# Patient Record
Sex: Female | Born: 1960 | Hispanic: No | Marital: Married | Smoking: Never smoker
Health system: Southern US, Community
[De-identification: ages and names within clinical notes are randomized; demographics above are authoritative.]

## PROBLEM LIST (undated history)

## (undated) DIAGNOSIS — F419 Anxiety disorder, unspecified: Secondary | ICD-10-CM

## (undated) HISTORY — PX: FOOT SURGERY: SHX648

## (undated) HISTORY — PX: BREAST ENHANCEMENT SURGERY: SHX7

---

## 2005-12-28 ENCOUNTER — Other Ambulatory Visit: Admission: RE | Admit: 2005-12-28 | Discharge: 2005-12-28 | Payer: Self-pay | Admitting: Gynecology

## 2010-05-25 ENCOUNTER — Encounter: Payer: Self-pay | Admitting: Family Medicine

## 2013-10-13 ENCOUNTER — Encounter (HOSPITAL_BASED_OUTPATIENT_CLINIC_OR_DEPARTMENT_OTHER): Payer: Self-pay | Admitting: Emergency Medicine

## 2013-10-13 ENCOUNTER — Emergency Department (HOSPITAL_BASED_OUTPATIENT_CLINIC_OR_DEPARTMENT_OTHER)
Admission: EM | Admit: 2013-10-13 | Discharge: 2013-10-13 | Disposition: A | Discharge status: Home / Self Care | Payer: Worker's Compensation | Attending: Emergency Medicine | Admitting: Emergency Medicine

## 2013-10-13 ENCOUNTER — Emergency Department (HOSPITAL_BASED_OUTPATIENT_CLINIC_OR_DEPARTMENT_OTHER): Payer: Self-pay | Attending: Emergency Medicine

## 2013-10-13 DIAGNOSIS — W11XXXA Fall on and from ladder, initial encounter: Secondary | ICD-10-CM | POA: Insufficient documentation

## 2013-10-13 DIAGNOSIS — Y929 Unspecified place or not applicable: Secondary | ICD-10-CM | POA: Insufficient documentation

## 2013-10-13 DIAGNOSIS — S5292XA Unspecified fracture of left forearm, initial encounter for closed fracture: Secondary | ICD-10-CM

## 2013-10-13 DIAGNOSIS — IMO0002 Reserved for concepts with insufficient information to code with codable children: Secondary | ICD-10-CM | POA: Insufficient documentation

## 2013-10-13 DIAGNOSIS — Y9389 Activity, other specified: Secondary | ICD-10-CM | POA: Insufficient documentation

## 2013-10-13 DIAGNOSIS — S52123A Displaced fracture of head of unspecified radius, initial encounter for closed fracture: Secondary | ICD-10-CM | POA: Insufficient documentation

## 2013-10-13 DIAGNOSIS — F411 Generalized anxiety disorder: Secondary | ICD-10-CM | POA: Insufficient documentation

## 2013-10-13 DIAGNOSIS — Z79899 Other long term (current) drug therapy: Secondary | ICD-10-CM | POA: Insufficient documentation

## 2013-10-13 HISTORY — DX: Anxiety disorder, unspecified: F41.9

## 2013-10-13 MED ORDER — OXYCODONE-ACETAMINOPHEN 5-325 MG PO TABS: 1.0000 | ORAL_TABLET | ORAL | Status: AC | PRN

## 2013-10-13 MED ORDER — OXYCODONE-ACETAMINOPHEN 5-325 MG PO TABS
1.0000 | ORAL_TABLET | Freq: Once | ORAL | Status: AC
  Administered 2013-10-13: 1 via ORAL
  Filled 2013-10-13: qty 1

## 2013-10-13 NOTE — ED Notes (Signed)
Pt c/o left wrist injury after fall at work x 6 hrs ago

## 2013-10-13 NOTE — ED Provider Notes (Signed)
CSN: [id number]     Arrival date & time 10/13/13  1849 History   First MD Initiated Contact with Patient 10/13/13 2014     Chief Complaint  Patient presents with  . Wrist Pain     (Consider location/radiation/quality/duration/timing/severity/associated sxs/prior Treatment) Patient is a 53 y.o. female presenting with wrist pain. The history is provided by the patient and the spouse. No language interpreter was used.  Wrist Pain This is a new problem. The current episode started today. Pertinent negatives include no chills or fever. Associated symptoms comments: She missed the last step of a ladder and fell backward on to flexed left wrist causing injury. She landed in a sitting position and complains of pain of her tail bone. She has been ambulatory without difficulty. No head injury, neck or back pain, chest or abdominal pain. .    Past Medical History  Diagnosis Date  . Anxiety    No past surgical history on file. No family history on file. History  Substance Use Topics  . Smoking status: Not on file  . Smokeless tobacco: Not on file  . Alcohol Use: Not on file   OB History   Grav Para Term Preterm Abortions TAB SAB Ect Mult Living                 Review of Systems  Constitutional: Negative for fever and chills.  HENT: Negative.   Respiratory: Negative.   Cardiovascular: Negative.   Gastrointestinal: Negative.   Musculoskeletal:       See HPI.  Skin: Negative.   Neurological: Negative.       Allergies  Review of patient's allergies indicates not on file.  Home Medications   Prior to Admission medications   Medication Sig Start Date End Date Taking? Authorizing Provider  escitalopram (LEXAPRO) 10 MG tablet Take 10 mg by mouth daily.   Yes Historical Provider, MD  zolpidem (AMBIEN) 5 MG tablet Take 5 mg by mouth at bedtime as needed for sleep.   Yes Historical Provider, MD   BP 145/90  Pulse 72  Temp(Src) 98.2 F (36.8 C)  Resp 18  Ht 5\' 6"  (1.676 m)  Wt  140 lb (63.504 kg)  BMI 22.61 kg/m2  SpO2 98% Physical Exam  Constitutional: She is oriented to person, place, and time. She appears well-developed and well-nourished. No distress.  Pulmonary/Chest: She exhibits no tenderness.  Abdominal: There is no tenderness.  Musculoskeletal:  Left wrist swollen dorsally. No bony deformity. FROM all digits. ROM wrist limited by pain. No midline cervical tenderness.   Neurological: She is alert and oriented to person, place, and time.  Skin: Skin is warm and dry.  Psychiatric: She has a normal mood and affect.    ED Course  Procedures (including critical care time) Labs Review Labs Reviewed - No data to display  Imaging Review Dg Wrist Complete Left  10/13/2013   CLINICAL DATA:  Fall from ladder.  Pain, swelling in left wrist.  EXAM: LEFT WRIST - COMPLETE 3+ VIEW  COMPARISON:  None.  FINDINGS: There is a distal left radial fracture, nondisplaced. Probable intra-articular extension. Overlying soft tissue swelling. No ulnar abnormality.  IMPRESSION: Nondisplaced distal left radial fracture.   Electronically Signed   By: Charlett NoseKevin  Dover M.D.   On: 10/13/2013 19:36     EKG Interpretation None      MDM   Final diagnoses:  None    1. Radial fracture, left 2. Fall  Non-displaced, closed radial fracture - splinted.  No neurovascular compromise. Refer to orthopedics - Dr. Pearletha ForgeHudnall.    Arnoldo HookerShari A Lakyra Tippins, PA-C 10/13/13 2048

## 2013-10-13 NOTE — Discharge Instructions (Signed)
Cast or Splint Care Casts and splints support injured limbs and keep bones from moving while they heal. It is important to care for your cast or splint at home.  HOME CARE INSTRUCTIONS  Keep the cast or splint uncovered during the drying period. It can take 24 to 48 hours to dry if it is made of plaster. A fiberglass cast will dry in less than 1 hour.  Do not rest the cast on anything harder than a pillow for the first 24 hours.  Do not put weight on your injured limb or apply pressure to the cast until your health care provider gives you permission.  Keep the cast or splint dry. Wet casts or splints can lose their shape and may not support the limb as well. A wet cast that has lost its shape can also create harmful pressure on your skin when it dries. Also, wet skin can become infected.  Cover the cast or splint with a plastic bag when bathing or when out in the rain or snow. If the cast is on the trunk of the body, take sponge baths until the cast is removed.  If your cast does become wet, dry it with a towel or a blow dryer on the cool setting only.  Keep your cast or splint clean. Soiled casts may be wiped with a moistened cloth.  Do not place any hard or soft foreign objects under your cast or splint, such as cotton, toilet paper, lotion, or powder.  Do not try to scratch the skin under the cast with any object. The object could get stuck inside the cast. Also, scratching could lead to an infection. If itching is a problem, use a blow dryer on a cool setting to relieve discomfort.  Do not trim or cut your cast or remove padding from inside of it.  Exercise all joints next to the injury that are not immobilized by the cast or splint. For example, if you have a long leg cast, exercise the hip joint and toes. If you have an arm cast or splint, exercise the shoulder, elbow, thumb, and fingers.  Elevate your injured arm or leg on 1 or 2 pillows for the first 1 to 3 days to decrease  swelling and pain.It is best if you can comfortably elevate your cast so it is higher than your heart. SEEK MEDICAL CARE IF:   Your cast or splint cracks.  Your cast or splint is too tight or too loose.  You have unbearable itching inside the cast.  Your cast becomes wet or develops a soft spot or area.  You have a bad smell coming from inside your cast.  You get an object stuck under your cast.  Your skin around the cast becomes red or raw.  You have new pain or worsening pain after the cast has been applied. SEEK IMMEDIATE MEDICAL CARE IF:   You have fluid leaking through the cast.  You are unable to move your fingers or toes.  You have discolored (blue or white), cool, painful, or very swollen fingers or toes beyond the cast.  You have tingling or numbness around the injured area.  You have severe pain or pressure under the cast.  You have any difficulty with your breathing or have shortness of breath.  You have chest pain. Document Released: 04/17/2000 Document Revised: 02/08/2013 Document Reviewed: 10/27/2012 [facility] Patient Information 2014 [place], [place].  Forearm Fracture Your caregiver has diagnosed you as having a broken bone (fracture) of  the forearm. This is the part of your arm between the elbow and your wrist. Your forearm is made up of two bones. These are the radius and ulna. A fracture is a break in one or both bones. A cast or splint is used to protect and keep your injured bone from moving. The cast or splint will be on generally for about 5 to 6 weeks, with individual variations. HOME CARE INSTRUCTIONS   Keep the injured part elevated while sitting or lying down. Keeping the injury above the level of your heart (the center of the chest). This will decrease swelling and pain.  Apply ice to the injury for 15-20 minutes, 03-04 times per day while awake, for 2 days. Put the ice in a plastic bag and place a thin towel between the bag of ice and your cast or  splint.  If you have a plaster or fiberglass cast:  Do not try to scratch the skin under the cast using sharp or pointed objects.  Check the skin around the cast every day. You may put lotion on any red or sore areas.  Keep your cast dry and clean.  If you have a plaster splint:  Wear the splint as directed.  You may loosen the elastic around the splint if your fingers become numb, tingle, or turn cold or blue.  Do not put pressure on any part of your cast or splint. It may break. Rest your cast only on a pillow the first 24 hours until it is fully hardened.  Your cast or splint can be protected during bathing with a plastic bag. Do not lower the cast or splint into water.  Only take over-the-counter or prescription medicines for pain, discomfort, or fever as directed by your caregiver. SEEK IMMEDIATE MEDICAL CARE IF:   Your cast gets damaged or breaks.  You have more severe pain or swelling than you did before the cast.  Your skin or nails below the injury turn blue or gray, or feel cold or numb.  There is a bad smell or new stains and/or pus like (purulent) drainage coming from under the cast. MAKE SURE YOU:   Understand these instructions.  Will watch your condition.  Will get help right away if you are not doing well or get worse. Document Released: 04/17/2000 Document Revised: 07/13/2011 Document Reviewed: 12/08/2007 [facility] Patient Information 2014 [place], [place]. Cryotherapy Cryotherapy means treatment with cold. Ice or gel packs can be used to reduce both pain and swelling. Ice is the most helpful within the first 24 to 48 hours after an injury or flareup from overusing a muscle or joint. Sprains, strains, spasms, burning pain, shooting pain, and aches can all be eased with ice. Ice can also be used when recovering from surgery. Ice is effective, has very few side effects, and is safe for most people to use. PRECAUTIONS  Ice is not a safe treatment option for  people with:  Raynaud's phenomenon. This is a condition affecting small blood vessels in the extremities. Exposure to cold may cause your problems to return.  Cold hypersensitivity. There are many forms of cold hypersensitivity, including:  Cold urticaria. Red, itchy hives appear on the skin when the tissues begin to warm after being iced.  Cold erythema. This is a red, itchy rash caused by exposure to cold.  Cold hemoglobinuria. Red blood cells break down when the tissues begin to warm after being iced. The hemoglobin that carry oxygen are passed into the urine because they  cannot combine with blood proteins fast enough.  Numbness or altered sensitivity in the area being iced. If you have any of the following conditions, do not use ice until you have discussed cryotherapy with your caregiver:  Heart conditions, such as arrhythmia, angina, or chronic heart disease.  High blood pressure.  Healing wounds or open skin in the area being iced.  Current infections.  Rheumatoid arthritis.  Poor circulation.  Diabetes. Ice slows the blood flow in the region it is applied. This is beneficial when trying to stop inflamed tissues from spreading irritating chemicals to surrounding tissues. However, if you expose your skin to cold temperatures for too long or without the proper protection, you can damage your skin or nerves. Watch for signs of skin damage due to cold. HOME CARE INSTRUCTIONS Follow these tips to use ice and cold packs safely.  Place a dry or damp towel between the ice and skin. A damp towel will cool the skin more quickly, so you may need to shorten the time that the ice is used.  For a more rapid response, add gentle compression to the ice.  Ice for no more than 10 to 20 minutes at a time. The bonier the area you are icing, the less time it will take to get the benefits of ice.  Check your skin after 5 minutes to make sure there are no signs of a poor response to cold or  skin damage.  Rest 20 minutes or more in between uses.  Once your skin is numb, you can end your treatment. You can test numbness by very lightly touching your skin. The touch should be so light that you do not see the skin dimple from the pressure of your fingertip. When using ice, most people will feel these normal sensations in this order: cold, burning, aching, and numbness.  Do not use ice on someone who cannot communicate their responses to pain, such as small children or people with dementia. HOW TO MAKE AN ICE PACK Ice packs are the most common way to use ice therapy. Other methods include ice massage, ice baths, and cryo-sprays. Muscle creams that cause a cold, tingly feeling do not offer the same benefits that ice offers and should not be used as a substitute unless recommended by your caregiver. To make an ice pack, do one of the following:  Place crushed ice or a bag of frozen vegetables in a sealable plastic bag. Squeeze out the excess air. Place this bag inside another plastic bag. Slide the bag into a pillowcase or place a damp towel between your skin and the bag.  Mix 3 parts water with 1 part rubbing alcohol. Freeze the mixture in a sealable plastic bag. When you remove the mixture from the freezer, it will be slushy. Squeeze out the excess air. Place this bag inside another plastic bag. Slide the bag into a pillowcase or place a damp towel between your skin and the bag. SEEK MEDICAL CARE IF:  You develop white spots on your skin. This may give the skin a blotchy (mottled) appearance.  Your skin turns blue or pale.  Your skin becomes waxy or hard.  Your swelling gets worse. MAKE SURE YOU:   Understand these instructions.  Will watch your condition.  Will get help right away if you are not doing well or get worse. Document Released: 12/15/2010 Document Revised: 07/13/2011 Document Reviewed: 12/15/2010 [facility] Patient Information 2014 [place], [place].

## 2013-10-14 NOTE — ED Provider Notes (Signed)
Medical screening examination/treatment/procedure(s) were performed by non-physician practitioner and as supervising physician I was immediately available for consultation/collaboration.   EKG Interpretation None        Vanetta MuldersScott Kerie Badger, MD 10/14/13 1645

## 2013-10-16 ENCOUNTER — Ambulatory Visit: Payer: Self-pay | Admitting: Family Medicine

## 2013-10-16 ENCOUNTER — Encounter: Payer: Self-pay | Admitting: Family Medicine

## 2013-10-16 ENCOUNTER — Ambulatory Visit (INDEPENDENT_AMBULATORY_CARE_PROVIDER_SITE_OTHER): Payer: Worker's Compensation | Admitting: Family Medicine

## 2013-10-16 VITALS — BP 122/75 | HR 73 | Ht 66.0 in | Wt 140.0 lb

## 2013-10-16 DIAGNOSIS — S52599A Other fractures of lower end of unspecified radius, initial encounter for closed fracture: Secondary | ICD-10-CM

## 2013-10-16 DIAGNOSIS — S52502A Unspecified fracture of the lower end of left radius, initial encounter for closed fracture: Secondary | ICD-10-CM

## 2013-10-16 NOTE — Patient Instructions (Addendum)
Wear splint for 7-10 days then follow up with me. We will remove this, repeat x-rays and put a cast on you at that time. Elevate above the level of your heart as much as possible. Aleve, oxycodone as needed for pain.

## 2013-10-17 ENCOUNTER — Encounter: Payer: Self-pay | Admitting: Family Medicine

## 2013-10-17 DIAGNOSIS — S52502A Unspecified fracture of the lower end of left radius, initial encounter for closed fracture: Secondary | ICD-10-CM | POA: Insufficient documentation

## 2013-10-17 NOTE — Progress Notes (Signed)
Patient ID: Bailey Rodriguez, female   DOB: [birth year], 53 y.o.   MRN: [id number]  PCP: Almedia BallsKELLY,SAM, MD  Subjective:   HPI: Patient is a 53 y.o. female here for left arm injury.  Patient reports on 6/12 she fell off a ladder down 2 steps and then backwards. Tried to catch herself and sustained FOOSH injury to left wrist. Immediate pain, swelling. No history of injuries here. Is right handed. Tried half the pain medication because full dose made her nauseous.  Past Medical History  Diagnosis Date  . Anxiety     Current Outpatient Prescriptions on File Prior to Visit  Medication Sig Dispense Refill  . escitalopram (LEXAPRO) 10 MG tablet Take 10 mg by mouth daily.      Marland Kitchen. oxyCODONE-acetaminophen (PERCOCET/ROXICET) 5-325 MG per tablet Take 1-2 tablets by mouth every 4 (four) hours as needed for severe pain.  15 tablet  0  . zolpidem (AMBIEN) 5 MG tablet Take 5 mg by mouth at bedtime as needed for sleep.       No current facility-administered medications on file prior to visit.    Past Surgical History  Procedure Laterality Date  . Foot surgery    . Breast enhancement surgery      No Known Allergies  History   Social History  . Marital Status: Married    Spouse Name: N/A    Number of Children: N/A  . Years of Education: N/A   Occupational History  . Not on file.   Social History Main Topics  . Smoking status: Never Smoker   . Smokeless tobacco: Not on file  . Alcohol Use: Not on file  . Drug Use: Not on file  . Sexual Activity: Not on file   Other Topics Concern  . Not on file   Social History Narrative  . No narrative on file    No family history on file.  BP 122/75  Pulse 73  Ht 5\' 6"  (1.676 m)  Wt 140 lb (63.504 kg)  BMI 22.61 kg/m2  Review of Systems: See HPI above.    Objective:  Physical Exam:  Gen: NAD  Left wrist: Did not remove wrist brace. Able to flex, extend, abduct digits. NVI distally.    Assessment & Plan:  1. Nondisplaced left  distal radius fracture - does extend into radiocarpal joint.  Will use splint for 7-10 days then return to have this removed, repeat radiographs.  Discussed higher risk of nonunion, DJD from intraarticular extension but this is nondisplaced which is a positive sign.  Aleve, oxycodone prn.

## 2013-10-17 NOTE — Assessment & Plan Note (Signed)
Nondisplaced left distal radius fracture - does extend into radiocarpal joint.  Will use splint for 7-10 days then return to have this removed, repeat radiographs.  Discussed higher risk of nonunion, DJD from intraarticular extension but this is nondisplaced which is a positive sign.  Aleve, oxycodone prn.

## 2013-10-20 ENCOUNTER — Encounter: Payer: Self-pay | Admitting: Family Medicine

## 2013-10-20 ENCOUNTER — Ambulatory Visit (INDEPENDENT_AMBULATORY_CARE_PROVIDER_SITE_OTHER): Payer: Worker's Compensation | Admitting: Family Medicine

## 2013-10-20 ENCOUNTER — Ambulatory Visit (HOSPITAL_BASED_OUTPATIENT_CLINIC_OR_DEPARTMENT_OTHER)
Admission: RE | Admit: 2013-10-20 | Discharge: 2013-10-20 | Disposition: A | Discharge status: Home / Self Care | Payer: Worker's Compensation | Source: Ambulatory Visit | Attending: Family Medicine | Admitting: Family Medicine

## 2013-10-20 ENCOUNTER — Telehealth: Payer: Self-pay | Admitting: Family Medicine

## 2013-10-20 VITALS — BP 155/93 | HR 65 | Ht 66.0 in | Wt 140.0 lb

## 2013-10-20 DIAGNOSIS — S5290XD Unspecified fracture of unspecified forearm, subsequent encounter for closed fracture with routine healing: Secondary | ICD-10-CM

## 2013-10-20 DIAGNOSIS — Z4789 Encounter for other orthopedic aftercare: Secondary | ICD-10-CM | POA: Insufficient documentation

## 2013-10-20 DIAGNOSIS — S52502D Unspecified fracture of the lower end of left radius, subsequent encounter for closed fracture with routine healing: Secondary | ICD-10-CM

## 2013-10-20 NOTE — Telephone Encounter (Signed)
If she can come in asap we can see her.  She's a week out so we could switch her to a cast if the splint is rubbing on her.

## 2013-10-23 ENCOUNTER — Encounter: Payer: Self-pay | Admitting: Family Medicine

## 2013-10-23 NOTE — Progress Notes (Signed)
Patient ID: Bailey Rodriguez, female   DOB: [birth year], 53 y.o.   MRN: [id number]  PCP: Almedia BallsKELLY,SAM, MD  Subjective:   HPI: Patient is a 53 y.o. female here for left arm injury.  6/15: Patient reports on 6/12 she fell off a ladder down 2 steps and then backwards. Tried to catch herself and sustained FOOSH injury to left wrist. Immediate pain, swelling. No history of injuries here. Is right handed. Tried half the pain medication because full dose made her nauseous.  6/19: Patient returns a little early because splint feels like it's rubbing too much and hurting at elbow and hand. No other complaints. Has percocet to take as needed.  Past Medical History  Diagnosis Date  . Anxiety     Current Outpatient Prescriptions on File Prior to Visit  Medication Sig Dispense Refill  . escitalopram (LEXAPRO) 10 MG tablet Take 10 mg by mouth daily.      Marland Kitchen. oxyCODONE-acetaminophen (PERCOCET/ROXICET) 5-325 MG per tablet Take 1-2 tablets by mouth every 4 (four) hours as needed for severe pain.  15 tablet  0  . zolpidem (AMBIEN) 5 MG tablet Take 5 mg by mouth at bedtime as needed for sleep.       No current facility-administered medications on file prior to visit.    Past Surgical History  Procedure Laterality Date  . Foot surgery    . Breast enhancement surgery      No Known Allergies  History   Social History  . Marital Status: Married    Spouse Name: N/A    Number of Children: N/A  . Years of Education: N/A   Occupational History  . Not on file.   Social History Main Topics  . Smoking status: Never Smoker   . Smokeless tobacco: Not on file  . Alcohol Use: Not on file  . Drug Use: Not on file  . Sexual Activity: Not on file   Other Topics Concern  . Not on file   Social History Narrative  . No narrative on file    No family history on file.  BP 155/93  Pulse 65  Ht 5\' 6"  (1.676 m)  Wt 140 lb (63.504 kg)  BMI 22.61 kg/m2  Review of Systems: See HPI above.     Objective:  Physical Exam:  Gen: NAD  Left wrist: Splint removed.   Mild swelling, bruising.  No other deformity. TTP distal radius.  No other tenderness. Did not test wrist ROM with known fracture.  Able to extend, abduct, oppose digits. NVI distally.    Assessment & Plan:  1. Nondisplaced left distal radius fracture - does extend into radiocarpal joint.  Repeat radiographs unchanged.   Short arm cast placed today.  Aleve, oxycodone prn.  F/u in 2 weeks for reevaluation, consider repeating x-rays at that visit after cast removal.  Will need at least 6 weeks immobilization.

## 2013-10-23 NOTE — Assessment & Plan Note (Signed)
Nondisplaced left distal radius fracture - does extend into radiocarpal joint.  Repeat radiographs unchanged.   Short arm cast placed today.  Aleve, oxycodone prn.  F/u in 2 weeks for reevaluation, consider repeating x-rays at that visit after cast removal.  Will need at least 6 weeks immobilization.

## 2013-10-25 ENCOUNTER — Ambulatory Visit: Payer: Self-pay | Admitting: Family Medicine

## 2013-11-02 ENCOUNTER — Ambulatory Visit (HOSPITAL_BASED_OUTPATIENT_CLINIC_OR_DEPARTMENT_OTHER)
Admission: RE | Admit: 2013-11-02 | Discharge: 2013-11-02 | Disposition: A | Discharge status: Home / Self Care | Payer: Worker's Compensation | Source: Ambulatory Visit | Attending: Family Medicine | Admitting: Family Medicine

## 2013-11-02 ENCOUNTER — Encounter: Payer: Self-pay | Admitting: Family Medicine

## 2013-11-02 ENCOUNTER — Ambulatory Visit (INDEPENDENT_AMBULATORY_CARE_PROVIDER_SITE_OTHER): Payer: Worker's Compensation | Admitting: Family Medicine

## 2013-11-02 VITALS — BP 111/76 | HR 79 | Ht 66.0 in | Wt 140.0 lb

## 2013-11-02 DIAGNOSIS — S6992XD Unspecified injury of left wrist, hand and finger(s), subsequent encounter: Secondary | ICD-10-CM

## 2013-11-02 DIAGNOSIS — S6990XA Unspecified injury of unspecified wrist, hand and finger(s), initial encounter: Secondary | ICD-10-CM

## 2013-11-02 DIAGNOSIS — S52502D Unspecified fracture of the lower end of left radius, subsequent encounter for closed fracture with routine healing: Secondary | ICD-10-CM

## 2013-11-02 DIAGNOSIS — S59909A Unspecified injury of unspecified elbow, initial encounter: Secondary | ICD-10-CM

## 2013-11-02 DIAGNOSIS — Z4789 Encounter for other orthopedic aftercare: Secondary | ICD-10-CM | POA: Insufficient documentation

## 2013-11-02 DIAGNOSIS — Z5189 Encounter for other specified aftercare: Secondary | ICD-10-CM

## 2013-11-02 DIAGNOSIS — S59919A Unspecified injury of unspecified forearm, initial encounter: Secondary | ICD-10-CM

## 2013-11-02 DIAGNOSIS — S5290XD Unspecified fracture of unspecified forearm, subsequent encounter for closed fracture with routine healing: Secondary | ICD-10-CM

## 2013-11-06 ENCOUNTER — Encounter: Payer: Self-pay | Admitting: Family Medicine

## 2013-11-06 NOTE — Assessment & Plan Note (Signed)
Nondisplaced left distal radius fracture - Repeat radiographs show interval healing.  New cast placed today.  F/u when she is 6 weeks out to remove cast.  Consider repeat radiographs if she is still having pain at that time.

## 2013-11-06 NOTE — Progress Notes (Signed)
Patient ID: Bailey Rodriguez, female   DOB: May 15, 1960, [age] y.o.   MRN: [id number]  PCP: Almedia BallsKELLY,SAM, MD  Subjective:   HPI: Patient is a 53 y.o. female here for left arm injury.  6/15: Patient reports on 6/12 she fell off a ladder down 2 steps and then backwards. Tried to catch herself and sustained FOOSH injury to left wrist. Immediate pain, swelling. No history of injuries here. Is right handed. Tried half the pain medication because full dose made her nauseous.  6/19: Patient returns a little early because splint feels like it's rubbing too much and hurting at elbow and hand. No other complaints. Has percocet to take as needed.  7/2: Patient reports pain has improved though not completely. Pain worse at night. No other complaints.  Past Medical History  Diagnosis Date  . Anxiety     Current Outpatient Prescriptions on File Prior to Visit  Medication Sig Dispense Refill  . escitalopram (LEXAPRO) 10 MG tablet Take 10 mg by mouth daily.      Marland Kitchen. oxyCODONE-acetaminophen (PERCOCET/ROXICET) 5-325 MG per tablet Take 1-2 tablets by mouth every 4 (four) hours as needed for severe pain.  15 tablet  0  . zolpidem (AMBIEN) 5 MG tablet Take 5 mg by mouth at bedtime as needed for sleep.       No current facility-administered medications on file prior to visit.    Past Surgical History  Procedure Laterality Date  . Foot surgery    . Breast enhancement surgery      No Known Allergies  History   Social History  . Marital Status: Married    Spouse Name: N/A    Number of Children: N/A  . Years of Education: N/A   Occupational History  . Not on file.   Social History Main Topics  . Smoking status: Never Smoker   . Smokeless tobacco: Not on file  . Alcohol Use: Not on file  . Drug Use: Not on file  . Sexual Activity: Not on file   Other Topics Concern  . Not on file   Social History Narrative  . No narrative on file    No family history on file.  BP 111/76  Pulse 79   Ht 5\' 6"  (1.676 m)  Wt 140 lb (63.504 kg)  BMI 22.61 kg/m2  Review of Systems: See HPI above.    Objective:  Physical Exam:  Gen: NAD  Left wrist: Cast removed.   No swelling, bruising, other deformity. Mild TTP distal radius.  No other tenderness. Did not test wrist ROM with known fracture.  Able to extend, abduct, oppose digits. NVI distally.    Assessment & Plan:  1. Nondisplaced left distal radius fracture - Repeat radiographs show interval healing.  New cast placed today.  F/u when she is 6 weeks out to remove cast.  Consider repeat radiographs if she is still having pain at that time.

## 2013-11-10 ENCOUNTER — Encounter: Payer: Self-pay | Admitting: Family Medicine

## 2013-11-10 ENCOUNTER — Ambulatory Visit (INDEPENDENT_AMBULATORY_CARE_PROVIDER_SITE_OTHER): Payer: Worker's Compensation | Admitting: Family Medicine

## 2013-11-10 VITALS — BP 110/81 | HR 72 | Ht 66.0 in | Wt 140.0 lb

## 2013-11-10 DIAGNOSIS — S5290XD Unspecified fracture of unspecified forearm, subsequent encounter for closed fracture with routine healing: Secondary | ICD-10-CM

## 2013-11-10 DIAGNOSIS — S52502D Unspecified fracture of the lower end of left radius, subsequent encounter for closed fracture with routine healing: Secondary | ICD-10-CM

## 2013-11-16 ENCOUNTER — Encounter: Payer: Self-pay | Admitting: Family Medicine

## 2013-11-16 NOTE — Progress Notes (Signed)
Patient ID: Bailey GettingMarie Rodriguez, female   DOB: [birth year], 53 y.o.   MRN: [id number]  PCP: Almedia BallsKELLY,SAM, MD  Subjective:   HPI: Patient is a 53 y.o. female here for left arm injury.  6/15: Patient reports on 6/12 she fell off a ladder down 2 steps and then backwards. Tried to catch herself and sustained FOOSH injury to left wrist. Immediate pain, swelling. No history of injuries here. Is right handed. Tried half the pain medication because full dose made her nauseous.  6/19: Patient returns a little early because splint feels like it's rubbing too much and hurting at elbow and hand. No other complaints. Has percocet to take as needed.  7/2: Patient reports pain has improved though not completely. Pain worse at night. No other complaints.  7/10: Patient returned early today as cast feels uncomfortable, feels like it's making her wrist and pinky finger numb and feels too tight.  Past Medical History  Diagnosis Date  . Anxiety     Current Outpatient Prescriptions on File Prior to Visit  Medication Sig Dispense Refill  . escitalopram (LEXAPRO) 10 MG tablet Take 10 mg by mouth daily.      Marland Kitchen. oxyCODONE-acetaminophen (PERCOCET/ROXICET) 5-325 MG per tablet Take 1-2 tablets by mouth every 4 (four) hours as needed for severe pain.  15 tablet  0  . zolpidem (AMBIEN) 5 MG tablet Take 5 mg by mouth at bedtime as needed for sleep.       No current facility-administered medications on file prior to visit.    Past Surgical History  Procedure Laterality Date  . Foot surgery    . Breast enhancement surgery      No Known Allergies  History   Social History  . Marital Status: Married    Spouse Name: N/A    Number of Children: N/A  . Years of Education: N/A   Occupational History  . Not on file.   Social History Main Topics  . Smoking status: Never Smoker   . Smokeless tobacco: Not on file  . Alcohol Use: Not on file  . Drug Use: Not on file  . Sexual Activity: Not on file    Other Topics Concern  . Not on file   Social History Narrative  . No narrative on file    No family history on file.  BP 110/81  Pulse 72  Ht 5\' 6"  (1.676 m)  Wt 140 lb (63.504 kg)  BMI 22.61 kg/m2  Review of Systems: See HPI above.    Objective:  Physical Exam:  Gen: NAD  Left wrist: Cast removed.   No swelling, bruising, other deformity. Minimal TTP distal radius.  No other tenderness. Did not test wrist ROM with known fracture.  Able to extend, abduct, oppose digits. NVI distally.    Assessment & Plan:  1. Nondisplaced left distal radius fracture - Cast removed today.  Minimal tenderness on exam and last visit had healing noted on radiographs.  Will switch to a wrist brace as this should feel more comfortable and with the healing already, very unlikely to have nonunion or displacement at this point.  Consider repeat radiographs at next visit when 6 weeks out depending on her clinical picture.

## 2013-11-16 NOTE — Assessment & Plan Note (Signed)
Nondisplaced left distal radius fracture - Cast removed today.  Minimal tenderness on exam and last visit had healing noted on radiographs.  Will switch to a wrist brace as this should feel more comfortable and with the healing already, very unlikely to have nonunion or displacement at this point.  Consider repeat radiographs at next visit when 6 weeks out depending on her clinical picture.

## 2013-11-23 ENCOUNTER — Encounter: Payer: Self-pay | Admitting: Family Medicine

## 2013-11-23 ENCOUNTER — Ambulatory Visit (INDEPENDENT_AMBULATORY_CARE_PROVIDER_SITE_OTHER): Payer: Worker's Compensation | Admitting: Family Medicine

## 2013-11-23 ENCOUNTER — Ambulatory Visit (HOSPITAL_BASED_OUTPATIENT_CLINIC_OR_DEPARTMENT_OTHER)
Admission: RE | Admit: 2013-11-23 | Discharge: 2013-11-23 | Disposition: A | Discharge status: Home / Self Care | Payer: Worker's Compensation | Source: Ambulatory Visit | Attending: Family Medicine | Admitting: Family Medicine

## 2013-11-23 VITALS — BP 117/79 | HR 75 | Ht 66.0 in | Wt 140.0 lb

## 2013-11-23 DIAGNOSIS — S5290XD Unspecified fracture of unspecified forearm, subsequent encounter for closed fracture with routine healing: Secondary | ICD-10-CM

## 2013-11-23 DIAGNOSIS — S52502D Unspecified fracture of the lower end of left radius, subsequent encounter for closed fracture with routine healing: Secondary | ICD-10-CM

## 2013-11-23 DIAGNOSIS — Z4789 Encounter for other orthopedic aftercare: Secondary | ICD-10-CM | POA: Insufficient documentation

## 2013-11-24 ENCOUNTER — Encounter: Payer: Self-pay | Admitting: Family Medicine

## 2013-11-24 NOTE — Assessment & Plan Note (Signed)
Nondisplaced left distal radius fracture - 6 weeks out now and radiographs again showed healing.  Believe majority of her pain at this point is due to stiffness.  Will start ROM exercises at this point to regain motion, use wrist brace as needed.  Consider occupational therapy if still having trouble.  Otherwise f/u prn.  Note written to start light duty and transition to full duty.

## 2013-11-24 NOTE — Progress Notes (Signed)
Patient ID: Bailey Rodriguez, female   DOB: [birth year], 53 y.o.   MRN: [id number]  PCP: Almedia BallsKELLY,SAM, MD  Subjective:   HPI: Patient is a 53 y.o. female here for left arm injury.  6/15: Patient reports on 6/12 she fell off a ladder down 2 steps and then backwards. Tried to catch herself and sustained FOOSH injury to left wrist. Immediate pain, swelling. No history of injuries here. Is right handed. Tried half the pain medication because full dose made her nauseous.  6/19: Patient returns a little early because splint feels like it's rubbing too much and hurting at elbow and hand. No other complaints. Has percocet to take as needed.  7/2: Patient reports pain has improved though not completely. Pain worse at night. No other complaints.  7/10: Patient returned early today as cast feels uncomfortable, feels like it's making her wrist and pinky finger numb and feels too tight.  7/23: Patient reports she's doing well. Wrist feels sore but improved. Tolerating brace without any issues.  Past Medical History  Diagnosis Date  . Anxiety     Current Outpatient Prescriptions on File Prior to Visit  Medication Sig Dispense Refill  . escitalopram (LEXAPRO) 10 MG tablet Take 10 mg by mouth daily.      Marland Kitchen. oxyCODONE-acetaminophen (PERCOCET/ROXICET) 5-325 MG per tablet Take 1-2 tablets by mouth every 4 (four) hours as needed for severe pain.  15 tablet  0  . zolpidem (AMBIEN) 5 MG tablet Take 5 mg by mouth at bedtime as needed for sleep.       No current facility-administered medications on file prior to visit.    Past Surgical History  Procedure Laterality Date  . Foot surgery    . Breast enhancement surgery      No Known Allergies  History   Social History  . Marital Status: Married    Spouse Name: N/A    Number of Children: N/A  . Years of Education: N/A   Occupational History  . Not on file.   Social History Main Topics  . Smoking status: Never Smoker   . Smokeless  tobacco: Not on file  . Alcohol Use: Not on file  . Drug Use: Not on file  . Sexual Activity: Not on file   Other Topics Concern  . Not on file   Social History Narrative  . No narrative on file    No family history on file.  BP 117/79  Pulse 75  Ht 5\' 6"  (1.676 m)  Wt 140 lb (63.504 kg)  BMI 22.61 kg/m2  Review of Systems: See HPI above.    Objective:  Physical Exam:  Gen: NAD  Left wrist: No swelling, bruising, other deformity. Minimal TTP wrist joint, distal radius.  No other tenderness. Mod limitation ROM wrist with mild pain.  Able to extend, abduct, oppose digits. NVI distally.    Assessment & Plan:  1. Nondisplaced left distal radius fracture - 6 weeks out now and radiographs again showed healing.  Believe majority of her pain at this point is due to stiffness.  Will start ROM exercises at this point to regain motion, use wrist brace as needed.  Consider occupational therapy if still having trouble.  Otherwise f/u prn.  Note written to start light duty and transition to full duty.

## 2015-06-15 IMAGING — CR DG WRIST COMPLETE 3+V*L*
4 series · 4 of 4 positions shown · non-contrast
Comparison: None.

CLINICAL DATA: Fall from ladder.  Pain, swelling in left wrist.

EXAM:
LEFT WRIST - COMPLETE 3+ VIEW

[x wrist pa left]
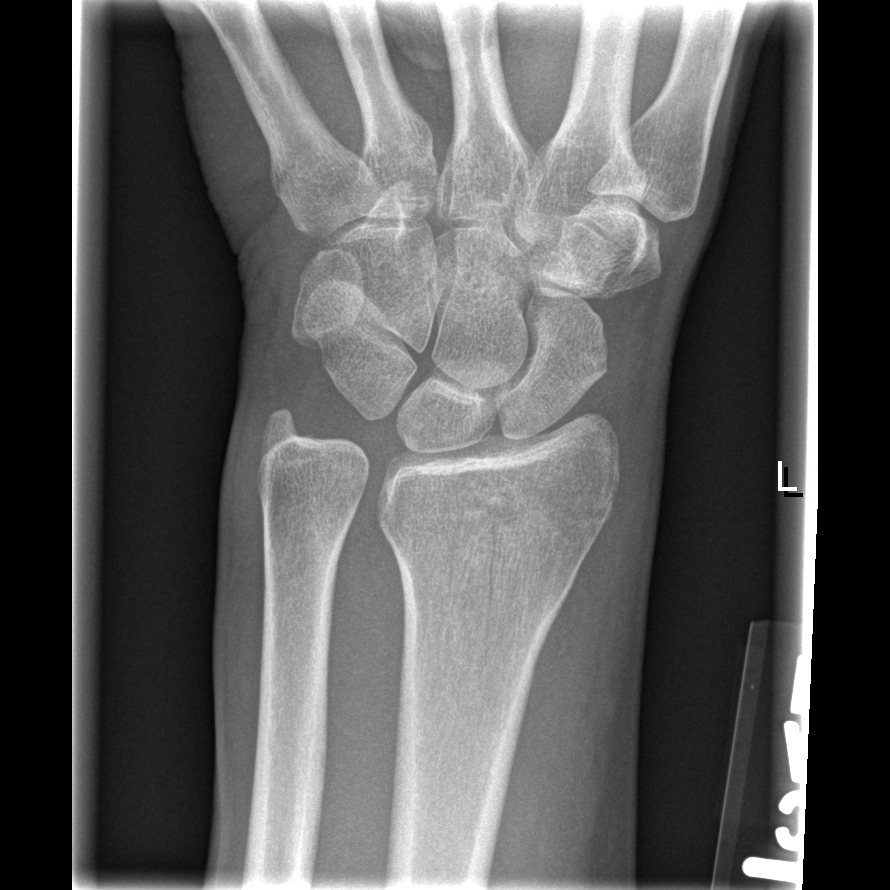

[x wrist obl left]
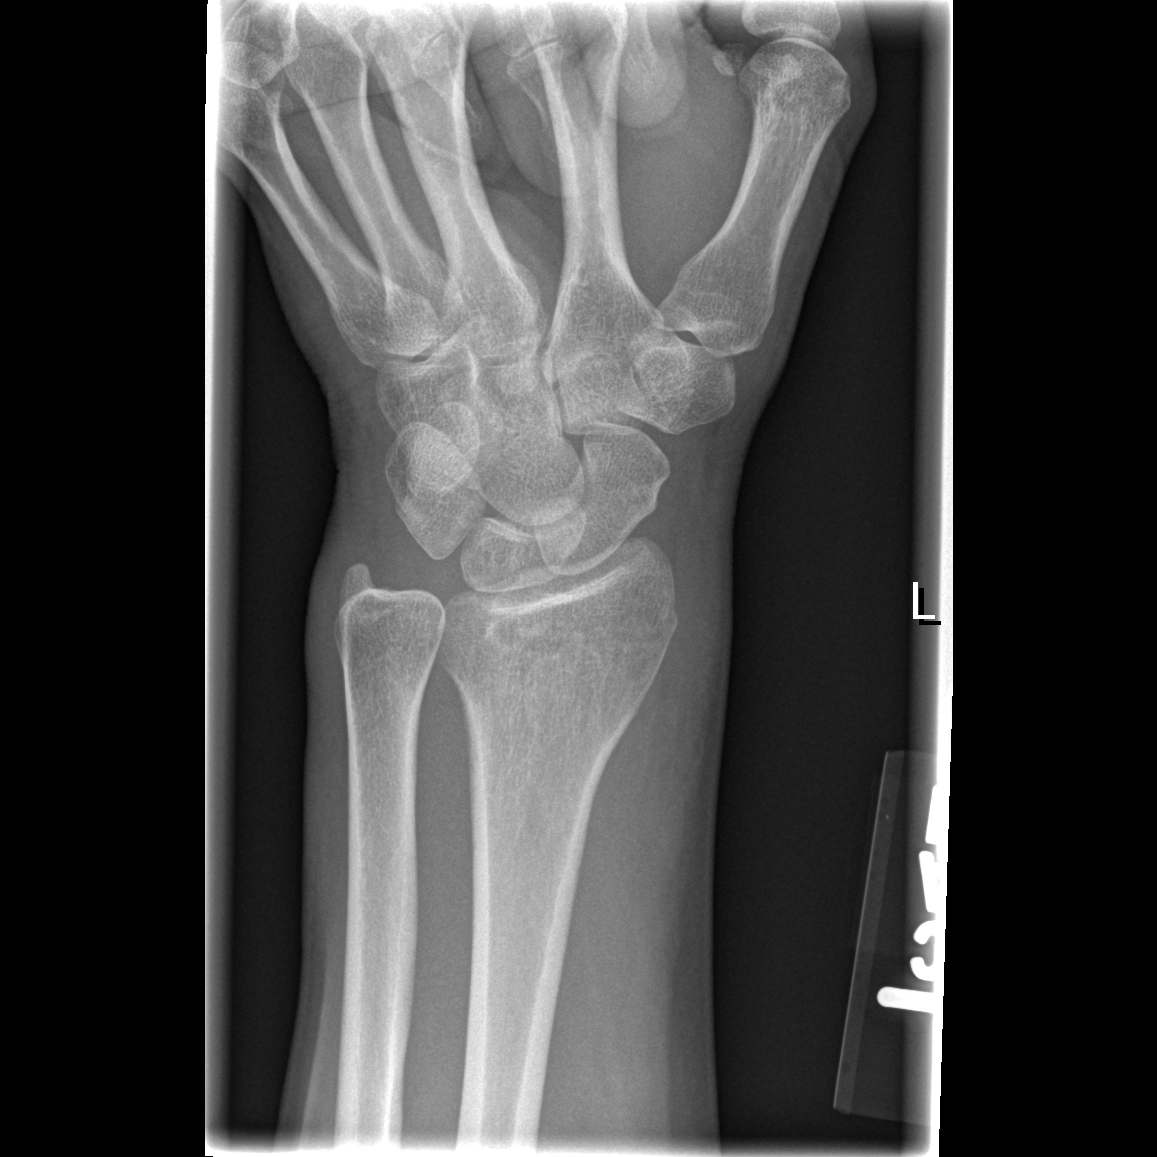

[x wrist lat left]
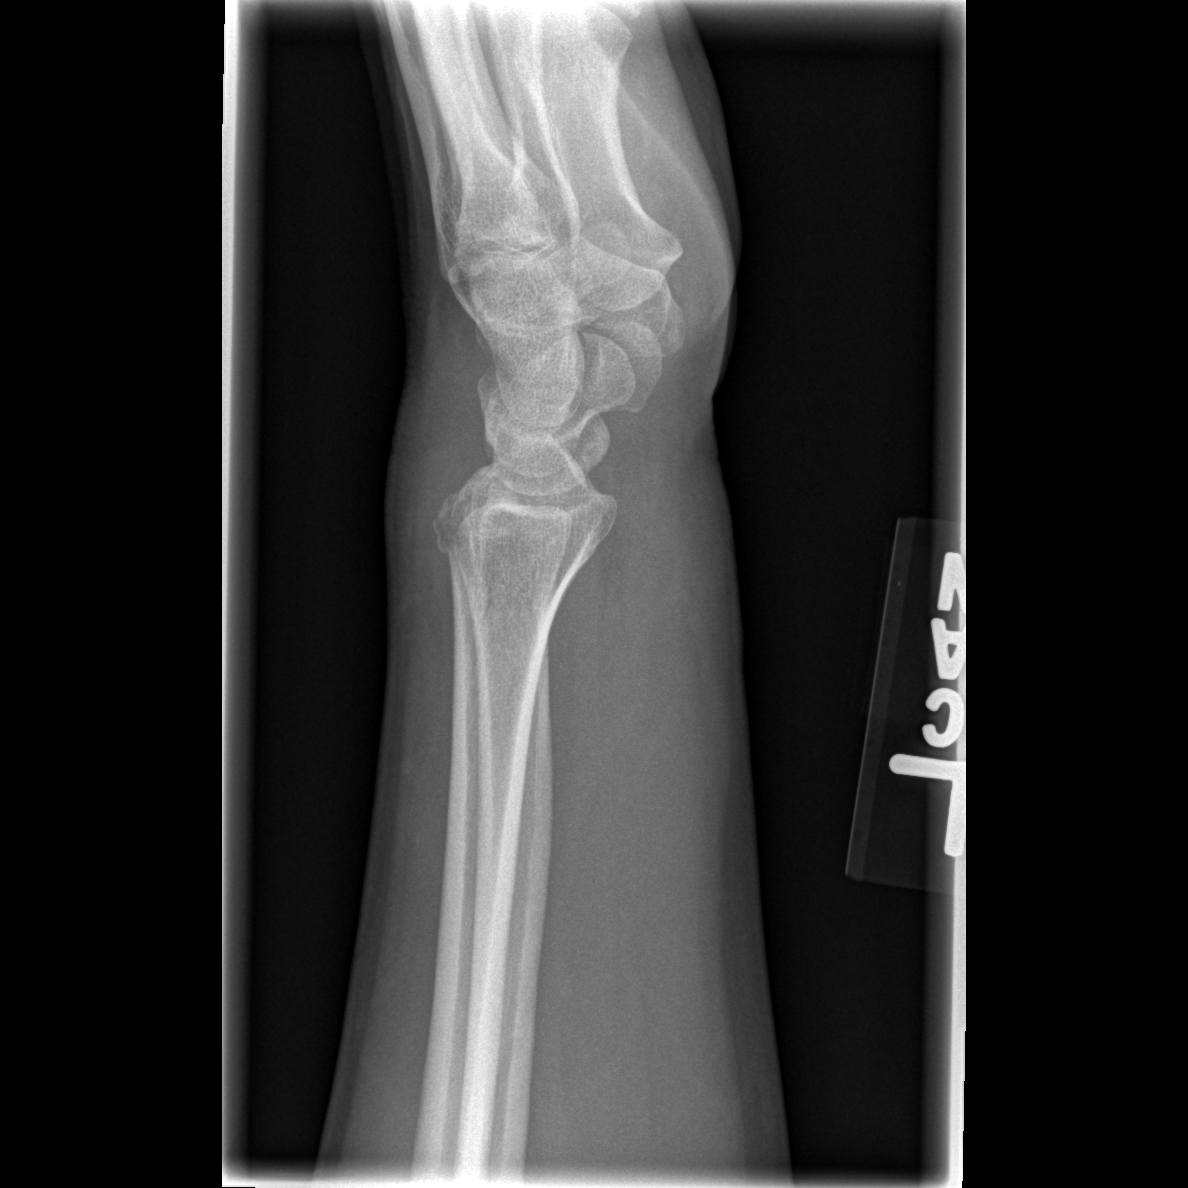

[x navicular]
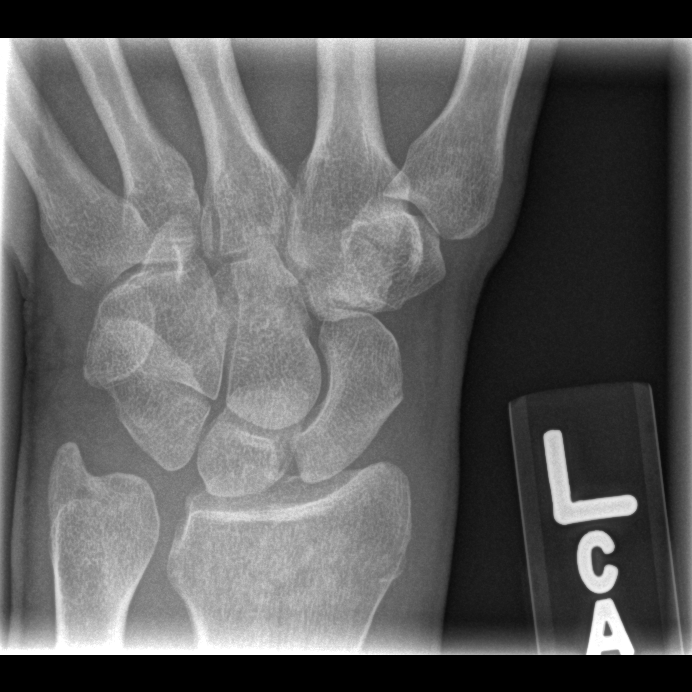

[4 of 4 positions shown; findings below may reference images not displayed]

FINDINGS: There is a distal left radial fracture, nondisplaced. Probable
intra-articular extension. Overlying soft tissue swelling. No ulnar
abnormality.
IMPRESSION: Nondisplaced distal left radial fracture.

## 2015-07-26 IMAGING — CR DG WRIST COMPLETE 3+V*L*
4 series · 4 of 4 positions shown · non-contrast
Comparison: 11/02/2013

CLINICAL DATA: Fracture follow up

EXAM:
LEFT WRIST - COMPLETE 3+ VIEW

[x wrist pa left]
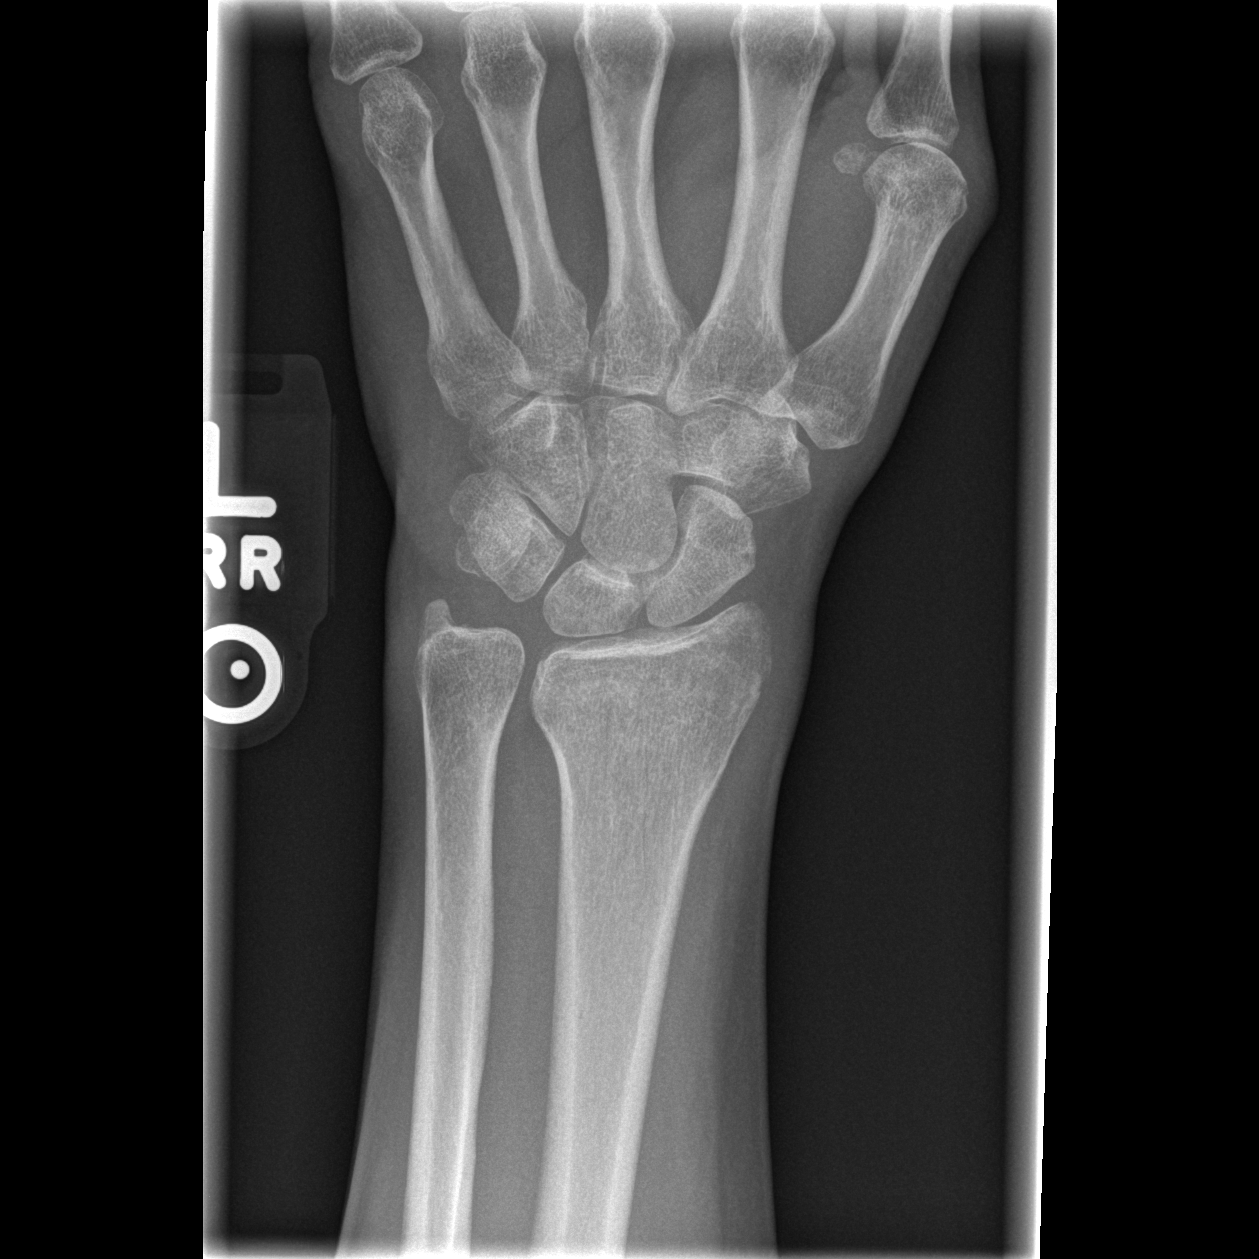

[x wrist obl left]
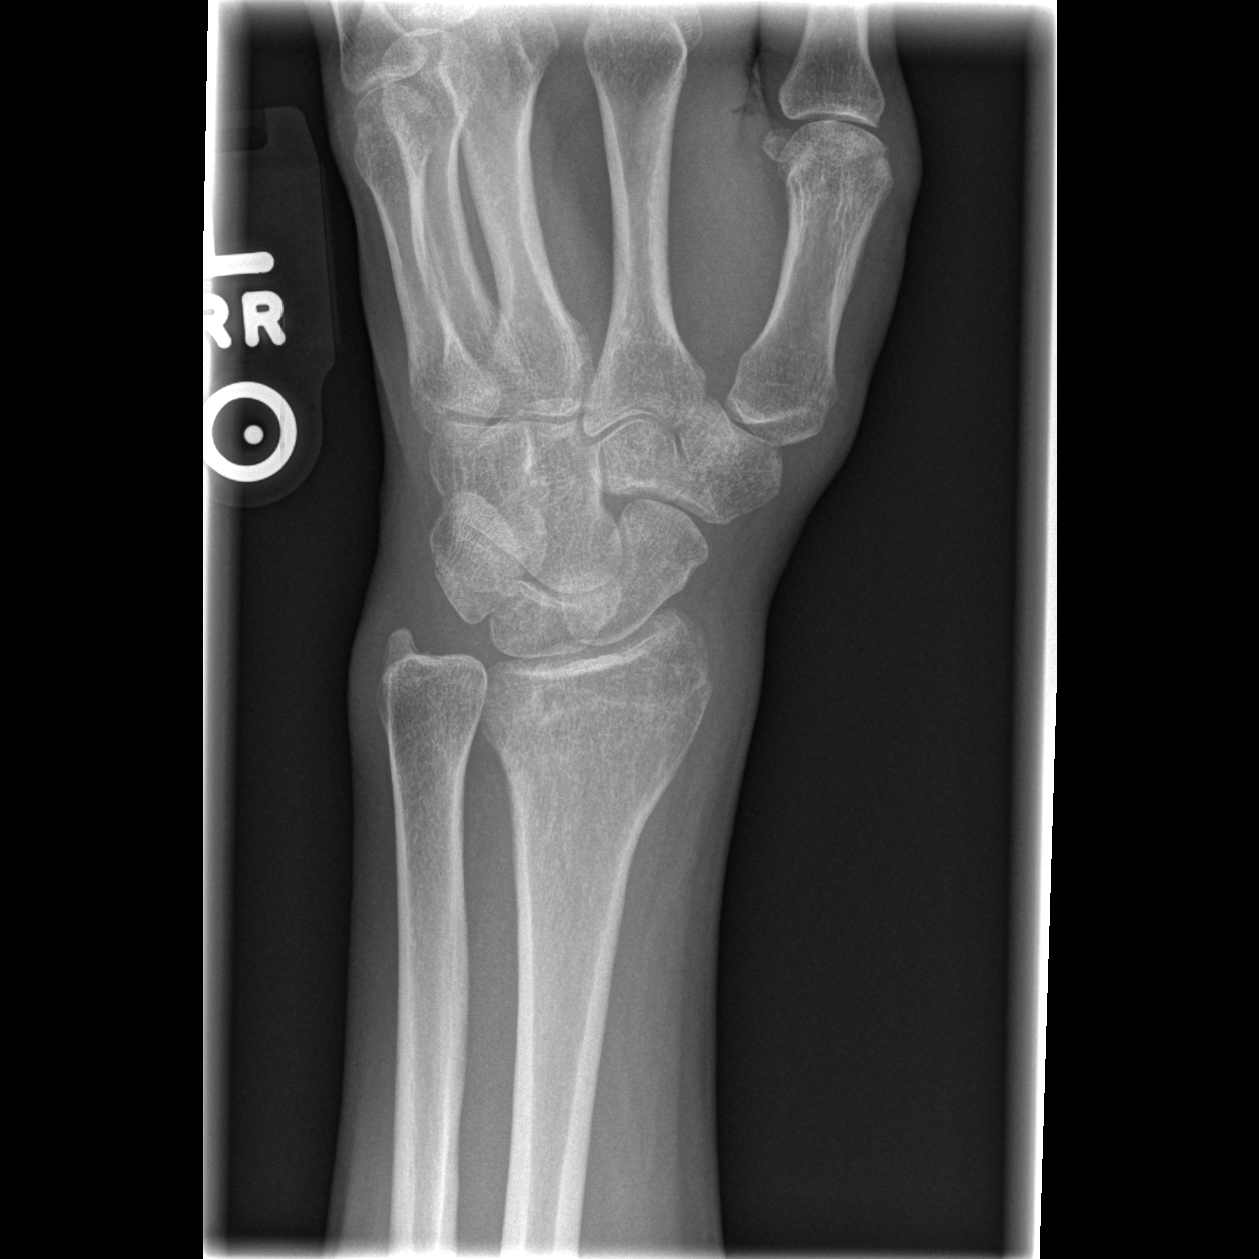

[x wrist lat left]
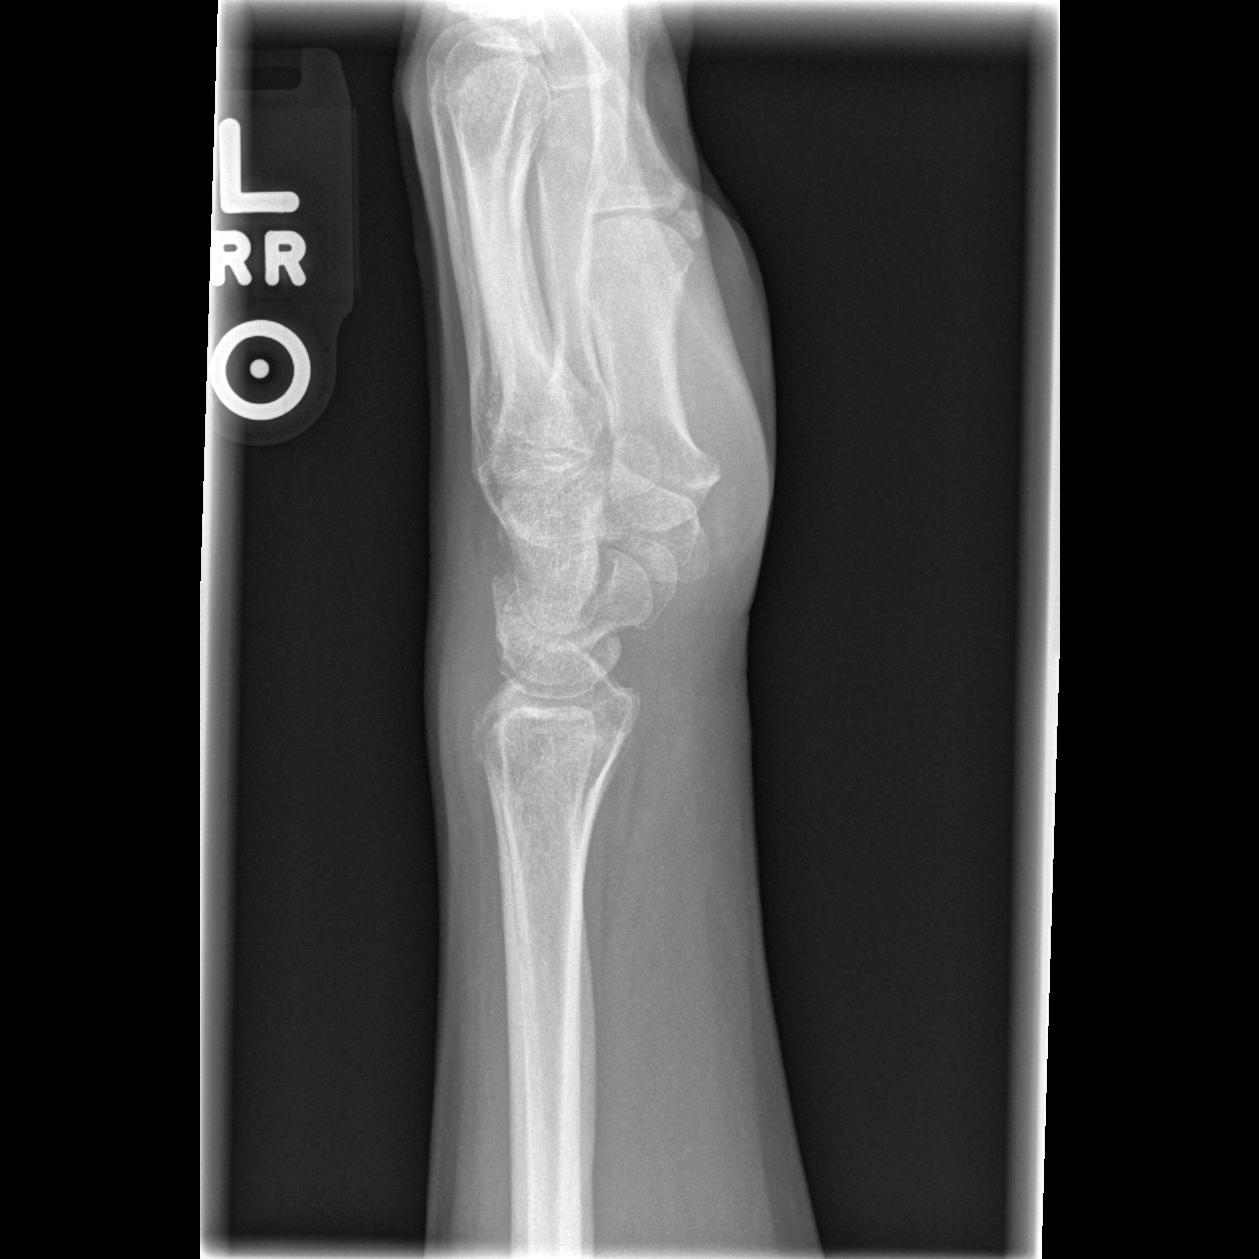

[x navicular]
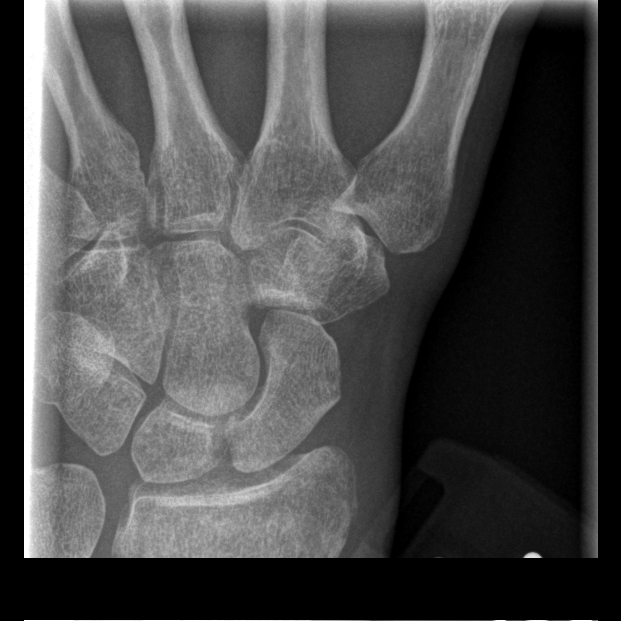

[4 of 4 positions shown; findings below may reference images not displayed]

FINDINGS: Healing fracture distal radius in satisfactory alignment. There is
interval healing and the fracture line is less apparent. Slightly
displaced dorsal fragment also shows interval healing. No fracture
the ulna. No other fracture.
IMPRESSION: Healing fracture distal radius.

## 2019-02-13 ENCOUNTER — Other Ambulatory Visit: Payer: Self-pay

## 2019-02-13 DIAGNOSIS — Z20822 Contact with and (suspected) exposure to covid-19: Secondary | ICD-10-CM

## 2019-02-14 LAB — NOVEL CORONAVIRUS, NAA: SARS-CoV-2, NAA: NOT DETECTED

## 2019-02-15 ENCOUNTER — Ambulatory Visit: Payer: Self-pay | Admitting: *Deleted

## 2019-02-15 NOTE — Telephone Encounter (Signed)
Charted in error.
